# Patient Record
Sex: Male | Born: 2000 | Race: Black or African American | Hispanic: No | Marital: Single
Health system: Southern US, Community
[De-identification: ages and names within clinical notes are randomized; demographics above are authoritative.]

## PROBLEM LIST (undated history)

## (undated) HISTORY — PX: TONSILLECTOMY: SUR1361

---
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
## 2022-03-22 ENCOUNTER — Encounter (HOSPITAL_COMMUNITY): Payer: Self-pay | Admitting: Emergency Medicine

## 2022-03-22 ENCOUNTER — Ambulatory Visit (HOSPITAL_COMMUNITY)
Admit: 2022-03-22 | Discharge: 2022-03-22 | Disposition: A | Discharge status: Home / Self Care | Payer: Medicaid Other | Attending: Internal Medicine | Admitting: Internal Medicine

## 2022-03-22 ENCOUNTER — Ambulatory Visit (HOSPITAL_COMMUNITY)
Admission: EM | Admit: 2022-03-22 | Discharge: 2022-03-22 | Disposition: A | Discharge status: Home / Self Care | Payer: Medicaid Other | Attending: Internal Medicine | Admitting: Internal Medicine

## 2022-03-22 DIAGNOSIS — N451 Epididymitis: Secondary | ICD-10-CM | POA: Diagnosis not present

## 2022-03-22 LAB — POCT URINALYSIS DIPSTICK, ED / UC
Bilirubin Urine: NEGATIVE
Glucose, UA: NEGATIVE mg/dL
Ketones, ur: 15 mg/dL — AB
Nitrite: NEGATIVE
Protein, ur: 30 mg/dL — AB
Specific Gravity, Urine: 1.025 (ref 1.005–1.030)
Urobilinogen, UA: 0.2 mg/dL (ref 0.0–1.0)
pH: 5.5 (ref 5.0–8.0)

## 2022-03-22 MED ORDER — DOXYCYCLINE HYCLATE 100 MG PO CAPS: 100.0000 mg | ORAL_CAPSULE | Freq: Two times a day (BID) | ORAL | 0 refills | Status: AC

## 2022-03-22 MED ORDER — CEFTRIAXONE SODIUM 500 MG IJ SOLR
INTRAMUSCULAR | Status: AC
  Filled 2022-03-22: qty 500

## 2022-03-22 MED ORDER — LIDOCAINE HCL (PF) 1 % IJ SOLN
INTRAMUSCULAR | Status: AC
  Filled 2022-03-22: qty 2

## 2022-03-22 MED ORDER — CEFTRIAXONE SODIUM 500 MG IJ SOLR
500.0000 mg | Freq: Once | INTRAMUSCULAR | Status: AC
  Administered 2022-03-22: 500 mg via INTRAMUSCULAR

## 2022-03-22 NOTE — Discharge Instructions (Addendum)
We will call you with recommendations if labs are abnormal Take medications as prescribed Ibuprofen as needed for pain Please go for the ultrasound of the testis If you have worsening symptoms please return to urgent care to be evaluated.

## 2022-03-22 NOTE — ED Triage Notes (Addendum)
Patient c/o LFT sided testicular pain that started this morning.   Patient denies difficulty with urination. Patient denies penile discharge. Patient denies any change in sexual partners or concern of STI.   Patient endorses testicular swelling.   Patient endorses increased pain with movement.   Patient endorses a previous episode of lymph node swelling on that same side of groin that has " gone down" per patient statement.   Patient hasn't taken any medications for symptoms.

## 2022-03-23 LAB — CYTOLOGY, (ORAL, ANAL, URETHRAL) ANCILLARY ONLY
Chlamydia: NEGATIVE
Comment: NEGATIVE
Comment: NEGATIVE
Comment: NORMAL
Neisseria Gonorrhea: NEGATIVE
Trichomonas: NEGATIVE

## 2022-03-23 NOTE — ED Provider Notes (Signed)
[facility]    CSN: [id number] Arrival date & time: 03/22/22  1109      History   Chief Complaint Chief Complaint  Patient presents with   Testicle Pain    HPI Todd Underwood is a 21 y.o. male comes to the urgent care with a 1 day history of left testicular pain.  Pain is sharp and throbbing.  Is aggravated by palpation with no known relieving factors.  It is associated with testicular swelling.  Patient denies any trauma to the testicle or the scrotum.  No groin pain.  No penile discharge, pain on urination, urgency of urination or frequency of urination.  Patient is sexually active.  He is in a monogamous relationship with his fiance.  No rash on the penis.   HPI  History reviewed. No pertinent past medical history.  There are no problems to display for this patient.   Past Surgical History:  Procedure Laterality Date   TONSILLECTOMY         Home Medications    Prior to Admission medications   Medication Sig Start Date End Date Taking? Authorizing Provider  doxycycline (VIBRAMYCIN) 100 MG capsule Take 1 capsule (100 mg total) by mouth 2 (two) times daily for 7 days. 03/22/22 03/29/22 Yes Jalah Warmuth, Britta Mccreedy, MD    Family History History reviewed. No pertinent family history.  Social History Social History   Tobacco Use   Smoking status: Passive Smoke Exposure - Never Smoker   Smokeless tobacco: Never     Allergies   Other   Review of Systems Review of Systems  Respiratory: Negative.    Gastrointestinal: Negative.   Genitourinary:  Positive for scrotal swelling and testicular pain. Negative for dysuria, frequency, penile pain, penile swelling and urgency.  Musculoskeletal: Negative.   Neurological: Negative.     Physical Exam Triage Vital Signs ED Triage Vitals  Enc Vitals Group     BP 03/22/22 1157 121/77     Pulse Rate 03/22/22 1157 81     Resp 03/22/22 1157 16     Temp 03/22/22 1157 98.5 F (36.9 C)     Temp Source 03/22/22 1157  Oral     SpO2 03/22/22 1157 98 %     Weight --      Height --      Head Circumference --      Peak Flow --      Pain Score 03/22/22 1203 10     Pain Loc --      Pain Edu? --      Excl. in GC? --    No data found.  Updated Vital Signs BP 121/77 (BP Location: Left Arm)   Pulse 81   Temp 98.5 F (36.9 C) (Oral)   Resp 16   SpO2 98%   Visual Acuity Right Eye Distance:   Left Eye Distance:   Bilateral Distance:    Right Eye Near:   Left Eye Near:    Bilateral Near:     Physical Exam Vitals and nursing note reviewed.  Constitutional:      Appearance: Normal appearance.  Cardiovascular:     Rate and Rhythm: Normal rate and regular rhythm.     Pulses: Normal pulses.     Heart sounds: Normal heart sounds.  Pulmonary:     Effort: Pulmonary effort is normal.     Breath sounds: Normal breath sounds.  Genitourinary:    Penis: Normal.      Comments: Left testicular pain with tenderness  on the posterior superior aspect of the left testicle.  No scrotal swelling or redness.  No rash on the penis.  No penile discharge. Neurological:     Mental Status: He is alert.     UC Treatments / Results  Labs (all labs ordered are listed, but only abnormal results are displayed) Labs Reviewed  URINE CULTURE - Abnormal; Notable for the following components:      Result Value   Culture >=100,000 COLONIES/mL GRAM NEGATIVE RODS (*)    All other components within normal limits  POCT URINALYSIS DIPSTICK, ED / UC - Abnormal; Notable for the following components:   Ketones, ur 15 (*)    Hgb urine dipstick SMALL (*)    Protein, ur 30 (*)    Leukocytes,Ua SMALL (*)    All other components within normal limits  CYTOLOGY, (ORAL, ANAL, URETHRAL) ANCILLARY ONLY    EKG   Radiology US SCROTUM W/DOPPLER  Result Date: 03/22/2022 CLINICAL DATA:  Left testicular pain and swelling EXAM: SCROTAL ULTRASOUND DOPPLER ULTRASOUND OF THE TESTICLES TECHNIQUE: Complete ultrasound examination of the  testicles, epididymis, and other scrotal structures was performed. Color and spectral Doppler ultrasound were also utilized to evaluate blood flow to the testicles. COMPARISON:  None Available. FINDINGS: Right testicle Measurements: 4.3 x 2.0 x 2.9 cm. No mass or microlithiasis visualized. Left testicle Measurements: 4.3 x 2.0 x 2.8 cm. No mass or microlithiasis visualized. Right epididymis:  Normal in size and appearance. Left epididymis: 1.0 cm simple cyst in the left epididymal head. Epididymal body and tail are enlarged and heterogeneous with increased vascularity. Hydrocele:  Small left hydrocele. Varicocele:  Bilateral varicoceles. Pulsed Doppler interrogation of both testes demonstrates normal low resistance arterial and venous waveforms bilaterally. Other: Right inguinal hernia with fluid seen in the hernia sac. IMPRESSION: 1. Negative for testicular torsion or intratesticular mass. 2. Appearance suggestive of acute left epididymitis. 3. Small left hydrocele, likely reactive. 4. Bilateral varicoceles. 5. Right inguinal hernia with fluid seen in the hernia sac. Electronically Signed   By: Duanne Guess D.O.   On: 03/22/2022 15:07    Procedures Procedures (including critical care time)  Medications Ordered in UC Medications  cefTRIAXone (ROCEPHIN) injection 500 mg (500 mg Intramuscular Given 03/22/22 1306)    Initial Impression / Assessment and Plan / UC Course  I have reviewed the triage vital signs and the nursing notes.  Pertinent labs & imaging results that were available during my care of the patient were reviewed by me and considered in my medical decision making (see chart for details).     1.  Acute epididymitis: Point-of-care urinalysis is positive for leukocyte esterase, hemoglobin Urine cultures have been sent Scrotal ultrasound is significant for epididymitis in the left testicle. Ceftriaxone 500 mg IM x1 dose Doxycycline 100 mg twice daily for 7 days We will call patient  with recommendations if labs are abnormal. Final Clinical Impressions(s) / UC Diagnoses   Final diagnoses:  Epididymitis     Discharge Instructions      We will call you with recommendations if labs are abnormal Take medications as prescribed Ibuprofen as needed for pain Please go for the ultrasound of the testis If you have worsening symptoms please return to urgent care to be evaluated.   ED Prescriptions     Medication Sig Dispense Auth. Provider   doxycycline (VIBRAMYCIN) 100 MG capsule Take 1 capsule (100 mg total) by mouth 2 (two) times daily for 7 days. 14 capsule Nilda Keathley, Britta Mccreedy, MD  PDMP not reviewed this encounter.   Merrilee JanskyLamptey, Raymonde Hamblin O, MD 03/23/22 [id number]

## 2022-03-24 LAB — URINE CULTURE: Culture: >=100000 — AB

## 2022-06-24 ENCOUNTER — Other Ambulatory Visit: Payer: Self-pay

## 2022-06-24 ENCOUNTER — Encounter (HOSPITAL_COMMUNITY): Payer: Self-pay | Admitting: *Deleted

## 2022-06-24 ENCOUNTER — Ambulatory Visit (HOSPITAL_COMMUNITY)
Admission: EM | Admit: 2022-06-24 | Discharge: 2022-06-24 | Disposition: A | Discharge status: Home / Self Care | Payer: Medicaid Other | Attending: Internal Medicine | Admitting: Internal Medicine

## 2022-06-24 DIAGNOSIS — R112 Nausea with vomiting, unspecified: Secondary | ICD-10-CM

## 2022-06-24 DIAGNOSIS — Z789 Other specified health status: Secondary | ICD-10-CM | POA: Diagnosis not present

## 2022-06-24 MED ORDER — ONDANSETRON 4 MG PO TBDP: 4.0000 mg | ORAL_TABLET | Freq: Three times a day (TID) | ORAL | 0 refills | Status: AC | PRN

## 2022-06-24 NOTE — ED Provider Notes (Signed)
[facility]    CSN: [id number] Arrival date & time: 06/24/22  1032      History   Chief Complaint Chief Complaint  Patient presents with   Emesis    HPI Todd Underwood is a 21 y.o. male.   Patient presents to urgent care for evaluation of nausea and vomiting after drinking an entire bottle of Hennessy brandy last night. States he walked into to work this morning at [employer], became nauseous, vomited one time, and was sent home from work.  He is requesting a work note.  Denies abdominal pain, headache, fever and chills, diarrhea, urinary symptoms, back pain, neck pain, dizziness, and sore throat.  Emesis was nonbloody/bilious.  He is not currently nauseous and states that he feels better after throwing up.  He has not attempted use of any over-the-counter medications prior to arrival urgent care for his symptoms.  States that he "does not usually drink that much".  No known sick contacts.     Emesis   History reviewed. No pertinent past medical history.  There are no problems to display for this patient.   Past Surgical History:  Procedure Laterality Date   TONSILLECTOMY         Home Medications    Prior to Admission medications   Medication Sig Start Date End Date Taking? Authorizing Provider  ondansetron (ZOFRAN-ODT) 4 MG disintegrating tablet Take 1 tablet (4 mg total) by mouth every 8 (eight) hours as needed for nausea or vomiting. 06/24/22  Yes Carlisle Beers, FNP    Family History History reviewed. No pertinent family history.  Social History Social History   Tobacco Use   Smoking status: Passive Smoke Exposure - Never Smoker   Smokeless tobacco: Never     Allergies   Other   Review of Systems Review of Systems  Gastrointestinal:  Positive for vomiting.  Per HPI   Physical Exam Triage Vital Signs ED Triage Vitals  Enc Vitals Group     BP 06/24/22 1121 114/73     Pulse Rate 06/24/22 1121 84     Resp 06/24/22 1121 16      Temp 06/24/22 1121 98.4 F (36.9 C)     Temp src --      SpO2 06/24/22 1121 98 %     Weight --      Height --      Head Circumference --      Peak Flow --      Pain Score 06/24/22 1120 0     Pain Loc --      Pain Edu? --      Excl. in GC? --    No data found.  Updated Vital Signs BP 114/73   Pulse 84   Temp 98.4 F (36.9 C)   Resp 16   SpO2 98%   Visual Acuity Right Eye Distance:   Left Eye Distance:   Bilateral Distance:    Right Eye Near:   Left Eye Near:    Bilateral Near:     Physical Exam Vitals and nursing note reviewed.  Constitutional:      Appearance: Normal appearance. He is not ill-appearing or toxic-appearing.     Comments: Very pleasant patient sitting on exam in position of comfort table in no acute distress.   HENT:     Head: Normocephalic and atraumatic.     Right Ear: Hearing and external ear normal.     Left Ear: Hearing and external ear normal.  Nose: Nose normal.     Mouth/Throat:     Lips: Pink.     Mouth: Mucous membranes are moist.     Pharynx: No posterior oropharyngeal erythema.  Eyes:     General: Lids are normal. Vision grossly intact. Gaze aligned appropriately.     Extraocular Movements: Extraocular movements intact.     Conjunctiva/sclera: Conjunctivae normal.  Cardiovascular:     Rate and Rhythm: Normal rate and regular rhythm.     Heart sounds: Normal heart sounds, S1 normal and S2 normal.  Pulmonary:     Effort: Pulmonary effort is normal. No respiratory distress.     Breath sounds: Normal breath sounds and air entry.  Abdominal:     General: Bowel sounds are normal.     Palpations: Abdomen is soft.     Tenderness: There is no abdominal tenderness. There is no right CVA tenderness, left CVA tenderness or guarding.  Musculoskeletal:     Cervical back: Neck supple.  Skin:    General: Skin is warm and dry.     Capillary Refill: Capillary refill takes less than 2 seconds.     Findings: No rash.  Neurological:      General: No focal deficit present.     Mental Status: He is alert and oriented to person, place, and time. Mental status is at baseline.     Cranial Nerves: No dysarthria or facial asymmetry.     Gait: Gait is intact.  Psychiatric:        Mood and Affect: Mood normal.        Speech: Speech normal.        Behavior: Behavior normal.        Thought Content: Thought content normal.        Judgment: Judgment normal.      UC Treatments / Results  Labs (all labs ordered are listed, but only abnormal results are displayed) Labs Reviewed - No data to display  EKG   Radiology No results found.  Procedures Procedures (including critical care time)  Medications Ordered in UC Medications - No data to display  Initial Impression / Assessment and Plan / UC Course  I have reviewed the triage vital signs and the nursing notes.  Pertinent labs & imaging results that were available during my care of the patient were reviewed by me and considered in my medical decision making (see chart for details).   1.  Nausea and vomiting due to heavy alcohol consumption Physical exam findings in the clinic are stable today.  Vital signs are hemodynamically stable as well.  Patient is not currently nauseous at that time and therefore we deferred Zofran administration in the clinic.  Zofran 4 mg sent to the pharmacy to be taken every 8 hours as needed for nausea and vomiting.  Advised patient to stop consuming alcohol as he is not of age.  Advised patient that if he is to drink alcohol, he is not to drive while drinking due to risk for injuring himself and others.  Advised patient to drink plenty of water to stay well-hydrated and eat bland foods over the next 12 to 24 hours to allow his stomach to heal.  Abdominal exam is stable and patient is agreeable with plan. Work note given.   Discussed physical exam and available lab work findings in clinic with patient.  Counseled patient regarding appropriate use of  medications and potential side effects for all medications recommended or prescribed today. Discussed red flag signs and  symptoms of worsening condition,when to call the PCP office, return to urgent care, and when to seek higher level of care in the emergency department. Patient verbalizes understanding and agreement with plan. All questions answered. Patient discharged in stable condition.  Final Clinical Impressions(s) / UC Diagnoses   Final diagnoses:  Nausea and vomiting, unspecified vomiting type  Alcohol consumption heavy     Discharge Instructions      Increase your water intake to at least 8 cups of water per day to prevent dehydration. Take Zofran every 8 hours as needed for nausea and vomiting. Eat a bland diet over the next 12 to 24 hours to allow your stomach to heal.  Avoid drinking excessive amounts of alcohol in the future.  Return to urgent care as needed if you develop any new or worsening symptoms.     ED Prescriptions     Medication Sig Dispense Auth. Provider   ondansetron (ZOFRAN-ODT) 4 MG disintegrating tablet Take 1 tablet (4 mg total) by mouth every 8 (eight) hours as needed for nausea or vomiting. 20 tablet Carlisle Beers, FNP      PDMP not reviewed this encounter.   Carlisle Beers, [place] 06/24/22 1214

## 2022-06-24 NOTE — ED Triage Notes (Signed)
PT reports he vomited at work x1 this AM and his work sent himto get a work note. Pt reports he was drinking alcohol ,a lot of it,last night.

## 2022-06-24 NOTE — Discharge Instructions (Signed)
Increase your water intake to at least 8 cups of water per day to prevent dehydration. Take Zofran every 8 hours as needed for nausea and vomiting. Eat a bland diet over the next 12 to 24 hours to allow your stomach to heal.  Avoid drinking excessive amounts of alcohol in the future.  Return to urgent care as needed if you develop any new or worsening symptoms.

## 2022-09-23 ENCOUNTER — Emergency Department (HOSPITAL_BASED_OUTPATIENT_CLINIC_OR_DEPARTMENT_OTHER): Payer: Medicaid Other

## 2022-09-23 ENCOUNTER — Emergency Department (HOSPITAL_BASED_OUTPATIENT_CLINIC_OR_DEPARTMENT_OTHER)
Admission: EM | Admit: 2022-09-23 | Discharge: 2022-09-23 | Disposition: A | Discharge status: Home / Self Care | Payer: Medicaid Other | Attending: Emergency Medicine | Admitting: Emergency Medicine

## 2022-09-23 ENCOUNTER — Ambulatory Visit
Admission: EM | Admit: 2022-09-23 | Discharge: 2022-09-23 | Disposition: A | Discharge status: Home / Self Care | Payer: Medicaid Other

## 2022-09-23 DIAGNOSIS — I861 Scrotal varices: Secondary | ICD-10-CM | POA: Diagnosis not present

## 2022-09-23 DIAGNOSIS — K4090 Unilateral inguinal hernia, without obstruction or gangrene, not specified as recurrent: Secondary | ICD-10-CM | POA: Diagnosis not present

## 2022-09-23 DIAGNOSIS — N452 Orchitis: Secondary | ICD-10-CM | POA: Diagnosis not present

## 2022-09-23 DIAGNOSIS — N50811 Right testicular pain: Secondary | ICD-10-CM | POA: Diagnosis present

## 2022-09-23 LAB — HIV ANTIBODY (ROUTINE TESTING W REFLEX): HIV Screen 4th Generation wRfx: NONREACTIVE

## 2022-09-23 MED ORDER — CEFTRIAXONE SODIUM 500 MG IJ SOLR
500.0000 mg | Freq: Once | INTRAMUSCULAR | Status: AC
  Administered 2022-09-23: 500 mg via INTRAMUSCULAR
  Filled 2022-09-23: qty 500

## 2022-09-23 MED ORDER — OXYCODONE-ACETAMINOPHEN 5-325 MG PO TABS
2.0000 | ORAL_TABLET | Freq: Once | ORAL | Status: AC
  Administered 2022-09-23: 2 via ORAL
  Filled 2022-09-23: qty 2

## 2022-09-23 MED ORDER — DOXYCYCLINE HYCLATE 100 MG PO TABS: 100.0000 mg | ORAL_TABLET | Freq: Two times a day (BID) | ORAL | 0 refills | Status: AC

## 2022-09-23 MED ORDER — LIDOCAINE HCL (PF) 1 % IJ SOLN
1.0000 mL | Freq: Once | INTRAMUSCULAR | Status: AC
  Administered 2022-09-23: 1 mL
  Filled 2022-09-23: qty 5

## 2022-09-23 MED ORDER — AZITHROMYCIN 250 MG PO TABS
1000.0000 mg | ORAL_TABLET | Freq: Once | ORAL | Status: AC
  Administered 2022-09-23: 1000 mg via ORAL
  Filled 2022-09-23: qty 4

## 2022-09-23 NOTE — ED Provider Notes (Signed)
[facility] EMERGENCY DEPT Provider Note   CSN: [id number] Arrival date & time: 09/23/22  1216     History  Chief Complaint  Patient presents with   Testicle Pain    Todd Underwood is a 21 y.o. male.  HPI   21 year old male presents today complaining of right testicle pain.  He states that he had noted some swelling for couple of days and then had pain that began this morning.  Initially presented to urgent care and was sent to the ED.  He also reports that he has some urethral discharge.  He reports he was treated for some similar symptoms about 6 months ago.  He reports 1 sexual partner for the past 2 years.  Home Medications Prior to Admission medications   Medication Sig Start Date End Date Taking? Authorizing Provider  doxycycline (VIBRA-TABS) 100 MG tablet Take 1 tablet (100 mg total) by mouth 2 (two) times daily. 09/23/22  Yes Margarita Grizzle, MD  ondansetron (ZOFRAN-ODT) 4 MG disintegrating tablet Take 1 tablet (4 mg total) by mouth every 8 (eight) hours as needed for nausea or vomiting. 06/24/22   Carlisle Beers, FNP      Allergies    Other    Review of Systems   Review of Systems  Physical Exam Updated Vital Signs BP (!) 130/90 (BP Location: Right Arm)   Pulse 88   Resp (!) 24   SpO2 97%  Physical Exam Vitals and nursing note reviewed.  Constitutional:      Appearance: Normal appearance.  HENT:     Head: Normocephalic.     Right Ear: External ear normal.     Left Ear: External ear normal.     Nose: Nose normal.     Mouth/Throat:     Pharynx: Oropharynx is clear.  Eyes:     Pupils: Pupils are equal, round, and reactive to light.  Cardiovascular:     Rate and Rhythm: Normal rate and regular rhythm.     Pulses: Normal pulses.  Pulmonary:     Effort: Pulmonary effort is normal.  Abdominal:     General: Abdomen is flat.  Genitourinary:    Penis: Normal.      Comments: Some swelling right testicle, no overlying redness or  fluctuance No urethral discharge noted on my exam Musculoskeletal:        General: Normal range of motion.  Skin:    General: Skin is warm and dry.     Capillary Refill: Capillary refill takes less than 2 seconds.  Neurological:     General: No focal deficit present.     Mental Status: He is alert.  Psychiatric:        Mood and Affect: Mood normal.     ED Results / Procedures / Treatments   Labs (all labs ordered are listed, but only abnormal results are displayed) Labs Reviewed  HIV ANTIBODY (ROUTINE TESTING W REFLEX)  GC/CHLAMYDIA PROBE AMP ([facility]) NOT AT [facility]    EKG None  Radiology US SCROTUM W/DOPPLER  Result Date: 09/23/2022 CLINICAL DATA:  Right testicular pain and swelling for 3 days. EXAM: SCROTAL ULTRASOUND DOPPLER ULTRASOUND OF THE TESTICLES TECHNIQUE: Complete ultrasound examination of the testicles, epididymis, and other scrotal structures was performed. Color and spectral Doppler ultrasound were also utilized to evaluate blood flow to the testicles. COMPARISON:  March 22, 2022 FINDINGS: Right testicle Measurements: 4.1 x 2.4 x 2.9 cm. No mass or microlithiasis visualized. Mild increased flow is identified in the right testicle  relative to the left. Left testicle Measurements: 4.1 x 2.1 x 2.6 cm. No mass or microlithiasis visualized. Right epididymis:  Normal in size and appearance. Left epididymis:  8 mm epididymal head cyst. Hydrocele:  None visualized. Varicocele:  Bilateral varicoceles. Pulsed Doppler interrogation of both testes demonstrates normal low resistance arterial and venous waveforms bilaterally. Right inguinal hernia with fluid seen in the hernia sac. IMPRESSION: 1. Probable mild right orchitis. 2. No evidence of testicular torsion. 3. Right inguinal hernia with fluid seen in the hernia sac. 4. Bilateral varicoceles. Electronically Signed   By: Sherian Rein M.D.   On: 09/23/2022 13:30    Procedures Procedures    Medications Ordered in ED Medications   cefTRIAXone (ROCEPHIN) injection 500 mg (has no administration in time range)  lidocaine (PF) (XYLOCAINE) 1 % injection 1-2.1 mL (has no administration in time range)  azithromycin (ZITHROMAX) tablet 1,000 mg (has no administration in time range)  oxyCODONE-acetaminophen (PERCOCET/ROXICET) 5-325 MG per tablet 2 tablet (2 tablets Oral Given 09/23/22 1245)    ED Course/ Medical Decision Making/ A&P Clinical Course as of 09/23/22 1417  Sun Sep 23, 2022  1413 Ultrasound obtained and significant for probable mild right orchitis, no evidence of testicular torsion, right inguinal hernia with fluid seen in the hernia sac and bilateral varicoceles [DR]    Clinical Course User Index [DR] Margarita Grizzle, MD                           Medical Decision Making 21 year old male presents today complaining of right testicle swelling followed by pain.  Here he is obtained ultrasound does not show any evidence of torsion.  He does have some evidence of orchitis and right inguinal hernia as well as bilateral varicoceles Patient is treated here with Rocephin and doxycycline He is advised regarding management of painful with cold therapy and elevation Plan outpatient referral to urology  Amount and/or Complexity of Data Reviewed Radiology: ordered.  Risk Prescription drug management.           Final Clinical Impression(s) / ED Diagnoses Final diagnoses:  Orchitis  Varicocele  Unilateral inguinal hernia without obstruction or gangrene, recurrence not specified    Rx / DC Orders ED Discharge Orders          Ordered    doxycycline (VIBRA-TABS) 100 MG tablet  2 times daily        09/23/22 1411              Margarita Grizzle, MD 09/23/22 1417

## 2022-09-23 NOTE — ED Notes (Signed)
Patient is being discharged from the Urgent Care and sent to the Emergency Department via ems . Per haley, patient is in need of higher level of care due to severe testicular pain. Patient is aware and verbalizes understanding of plan of care.  Vitals:   09/23/22 1110  Pulse: 88  Resp: (!) 24  SpO2: 97%

## 2022-09-23 NOTE — ED Triage Notes (Signed)
Pt c/o severe testicular pain and lymphadenopathy in the groin onset ~ 2 days ago concerned for torsion states this has happened before.

## 2022-09-23 NOTE — Discharge Instructions (Signed)
Please take antibiotics as prescribed You may elevate testicle and use acetaminophen and ibuprofen for pain When you have to get up you should use compression garments Please call urology for outpatient follow-up for hernia If area becomes hard or you are unable to reduce this area, please return for reevaluation

## 2022-09-23 NOTE — ED Notes (Signed)
Per EMS, pt sent to ED from UC via [facility] ambulance for possible testicular torsion.

## 2022-09-23 NOTE — ED Triage Notes (Addendum)
Pt reports right testicular pain started this morning after noting a swollen lymph node in the area 2 days ago. Pt reports 1 similar episode in the past and was treated with Abx for torsion and symptoms resolved.  Pt also states his semen has a yellowish tinge to it.  Denies any urinary symptoms, n/v/d.Marland Kitchen  Pt wrything in bed during triage. Seen at [facility] today and sent to ED for further evaluation.

## 2022-09-23 NOTE — Discharge Instructions (Addendum)
Patient sent to hospital via EMS.  

## 2022-09-23 NOTE — ED Provider Notes (Signed)
[facility]    CSN: [id number] Arrival date & time: 09/23/22  1105      History   Chief Complaint Chief Complaint  Patient presents with   Testicle Pain    HPI Todd Underwood is a 21 y.o. male.   Patient presents with right testicular pain that started 2 days ago.  Patient denies any obvious injury or trauma to the testicle.  He denies exposure to STD.  Denies penile discharge, dysuria, urinary frequency, abdominal pain, back pain, fever.  He has not taken any medications for pain.   Testicle Pain    History reviewed. No pertinent past medical history.  There are no problems to display for this patient.   Past Surgical History:  Procedure Laterality Date   TONSILLECTOMY         Home Medications    Prior to Admission medications   Medication Sig Start Date End Date Taking? Authorizing Provider  ondansetron (ZOFRAN-ODT) 4 MG disintegrating tablet Take 1 tablet (4 mg total) by mouth every 8 (eight) hours as needed for nausea or vomiting. 06/24/22   Carlisle Beers, FNP    Family History History reviewed. No pertinent family history.  Social History Social History   Tobacco Use   Smoking status: Passive Smoke Exposure - Never Smoker   Smokeless tobacco: Never     Allergies   Other   Review of Systems Review of Systems Per HPI  Physical Exam Triage Vital Signs ED Triage Vitals  Enc Vitals Group     BP --      Pulse Rate 09/23/22 1110 88     Resp 09/23/22 1110 (!) 24     Temp --      Temp src --      SpO2 09/23/22 1110 97 %     Weight --      Height --      Head Circumference --      Peak Flow --      Pain Score 09/23/22 1108 10     Pain Loc --      Pain Edu? --      Excl. in GC? --    No data found.  Updated Vital Signs BP 114/64   Pulse 88   Resp (!) 24   SpO2 97%   Visual Acuity Right Eye Distance:   Left Eye Distance:   Bilateral Distance:    Right Eye Near:   Left Eye Near:    Bilateral Near:      Physical Exam Exam conducted with a chaperone present.  Constitutional:      General: He is in acute distress.     Appearance: Normal appearance.     Comments: Patient is restless and will not sit still due to pain.  HENT:     Head: Normocephalic and atraumatic.  Pulmonary:     Effort: Pulmonary effort is normal.  Genitourinary:    Comments: Right testicular swelling and mild erythema.  Unable to evaluate cremasteric reflex given that patient will not cooperate or sit still. Neurological:     General: No focal deficit present.     Mental Status: He is alert and oriented to person, place, and time.      UC Treatments / Results  Labs (all labs ordered are listed, but only abnormal results are displayed) Labs Reviewed - No data to display  EKG   Radiology No results found.  Procedures Procedures (including critical care time)  Medications Ordered in UC  Medications - No data to display  Initial Impression / Assessment and Plan / UC Course  I have reviewed the triage vital signs and the nursing notes.  Pertinent labs & imaging results that were available during my care of the patient were reviewed by me and considered in my medical decision making (see chart for details).     Given the amount of pain that patient appears to be in, patient will most likely need an ultrasound of the testicle which cannot be performed here in urgent care.  Therefore, patient was advised to go to the ER for further evaluation and management.  Unable to get complete set of vital signs given that patient is restless and will not sit still or cooperate.  Patient was insistent on going by ambulance to the ER.  Patient left via EMS transport. Final Clinical Impressions(s) / UC Diagnoses   Final diagnoses:  Right testicular pain     Discharge Instructions      Patient sent to hospital via EMS.     ED Prescriptions   None    PDMP not reviewed this encounter.   Gustavus Bryant,  [place] 09/23/22 1140

## 2022-09-24 LAB — GC/CHLAMYDIA PROBE AMP (~~LOC~~) NOT AT ARMC
Chlamydia: NEGATIVE
Comment: NEGATIVE
Comment: NORMAL
Neisseria Gonorrhea: NEGATIVE

## 2023-10-18 IMAGING — US US SCROTUM W/ DOPPLER COMPLETE
1 series · 13 of 25 positions shown · non-contrast
Comparison: None Available.

CLINICAL DATA: Left testicular pain and swelling

EXAM:
SCROTAL ULTRASOUND
DOPPLER ULTRASOUND OF THE TESTICLES
TECHNIQUE: Complete ultrasound examination of the testicles, epididymis, and
other scrotal structures was performed. Color and spectral Doppler
ultrasound were also utilized to evaluate blood flow to the
testicles.

[Series 1: us scrotum w/doppler · 106 acquisitions, 13 frames shown]
[im 1/106]
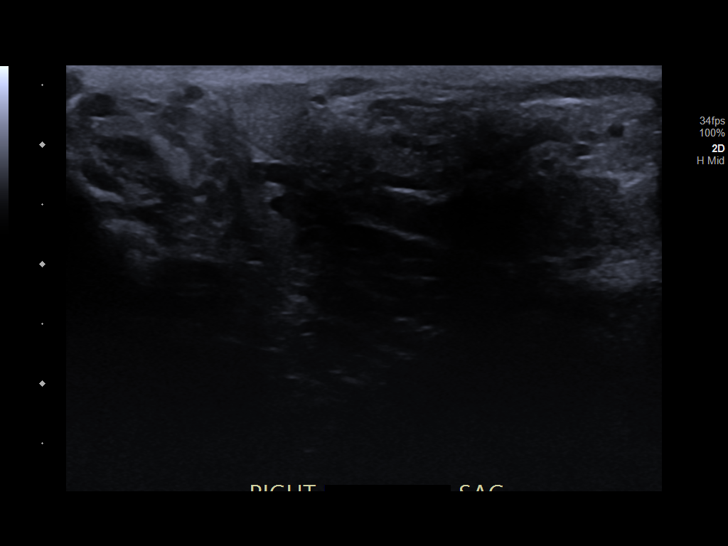
[im 9/106]
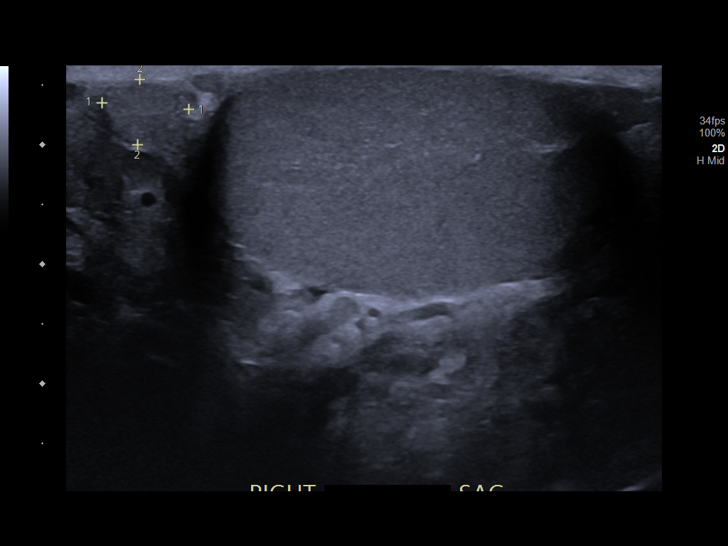
[im 18/106]
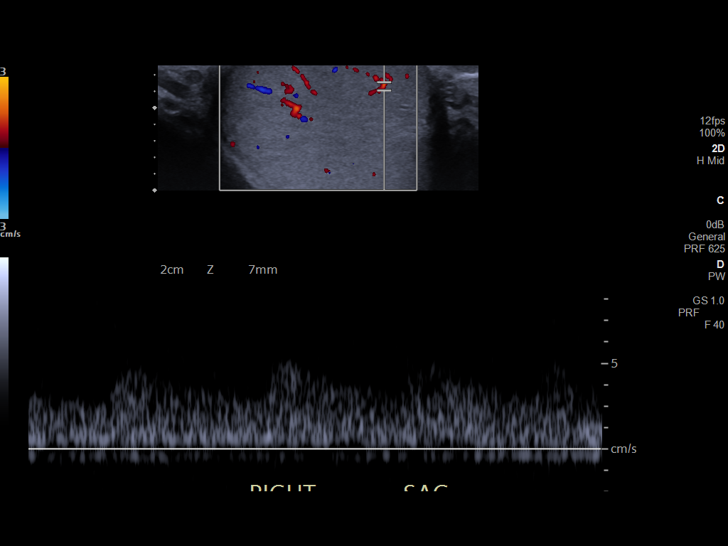
[im 27/106]
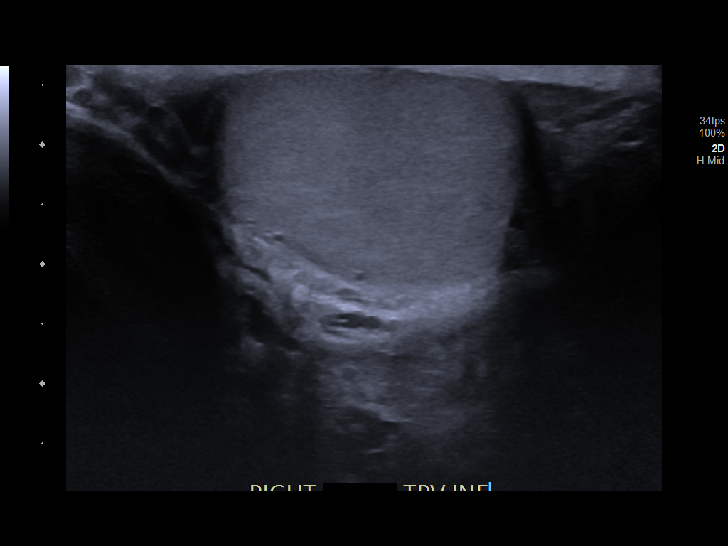
[im 36/106]
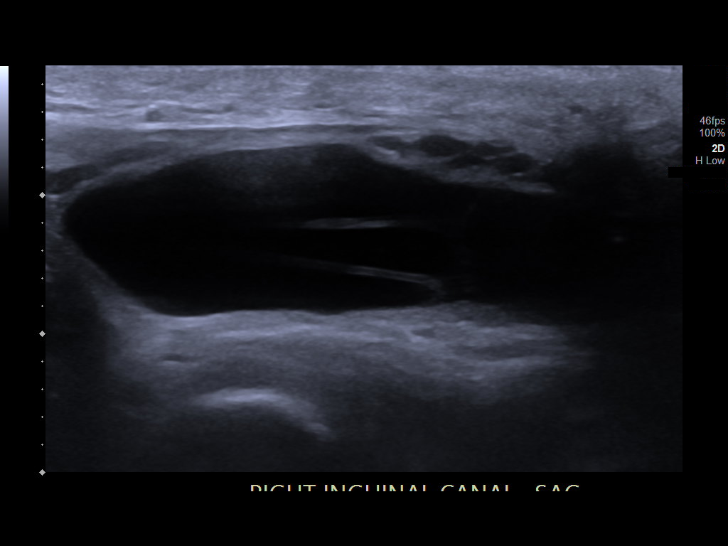
[im 44/106]
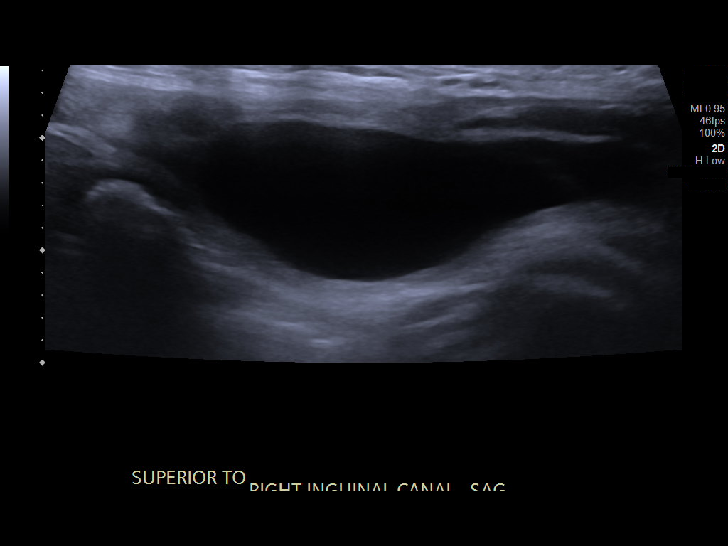
[im 53/106]
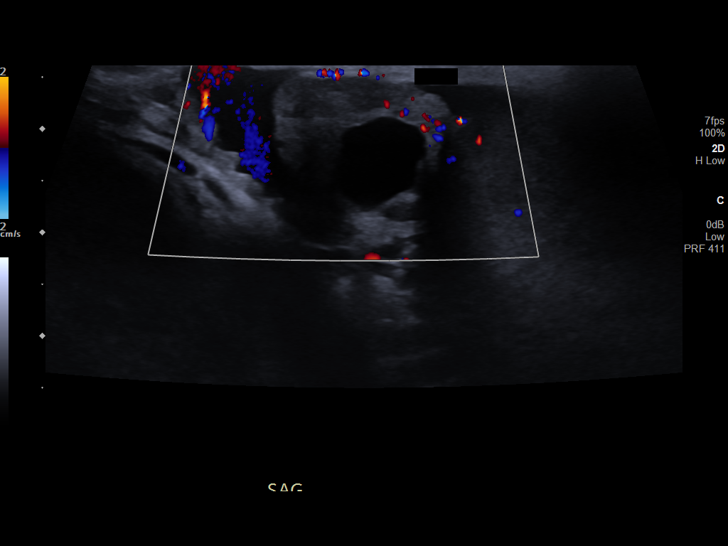
[im 62/106]
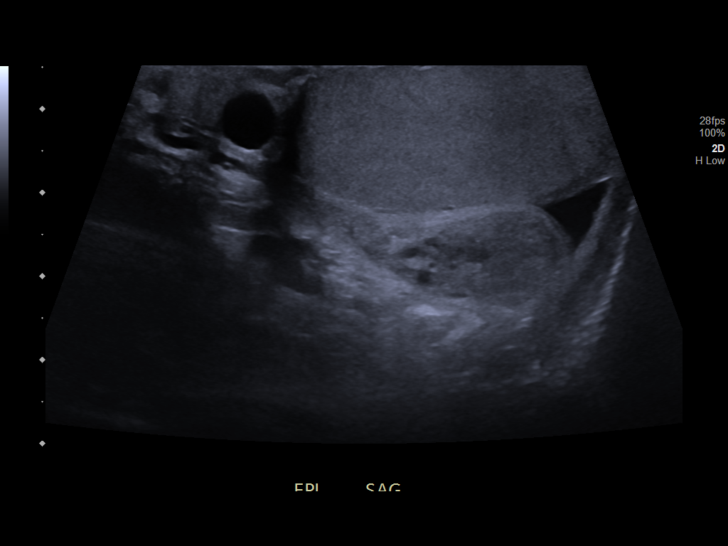
[im 71/106]
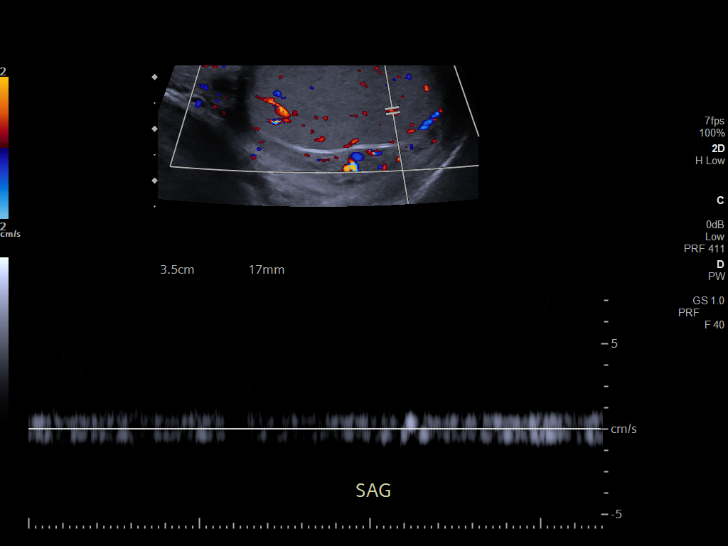
[im 79/106]
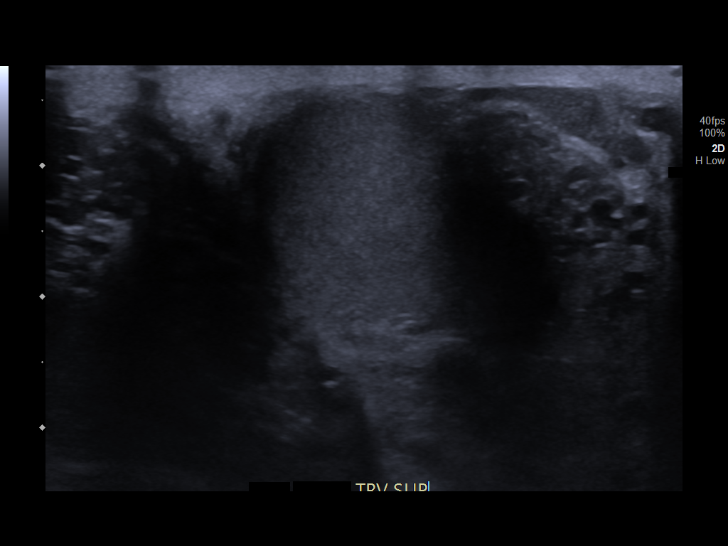
[im 88/106]
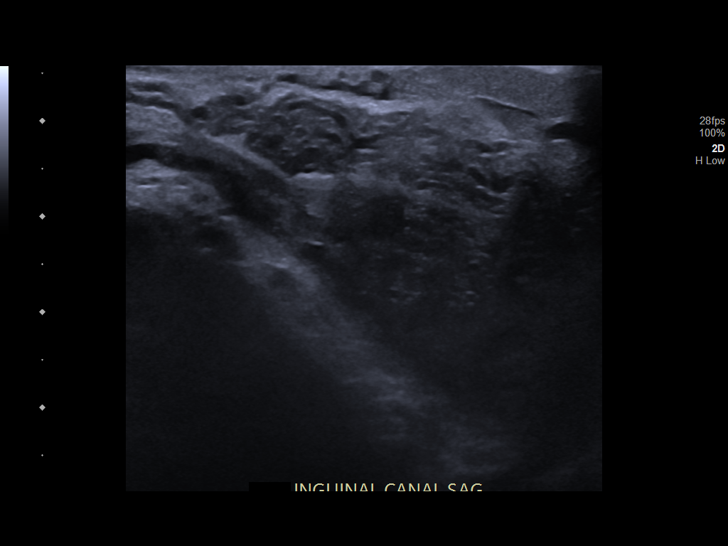
[im 97/106]
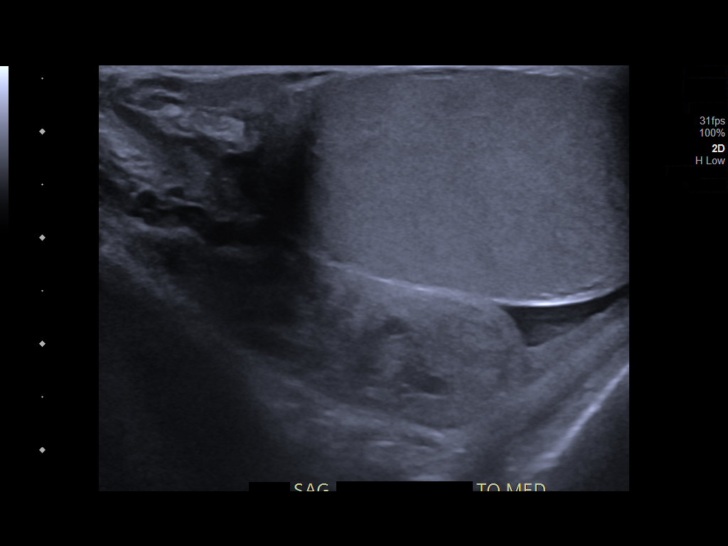
[im 106/106]
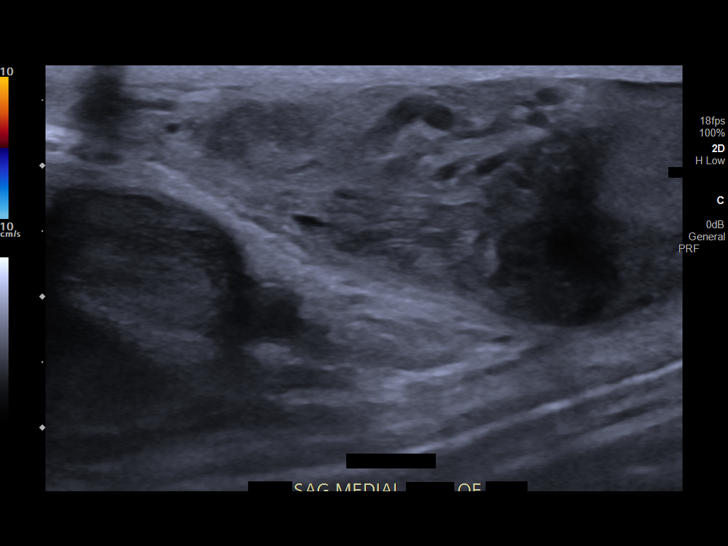

[13 of 25 positions shown; findings below may reference images not displayed]

FINDINGS: Right testicle

Measurements: 4.3 x 2.0 x 2.9 cm. No mass or microlithiasis
visualized.

Left testicle

Measurements: 4.3 x 2.0 x 2.8 cm. No mass or microlithiasis
visualized.

Right epididymis:  Normal in size and appearance.

Left epididymis: 1.0 cm simple cyst in the left epididymal head.
Epididymal body and tail are enlarged and heterogeneous with
increased vascularity.

Hydrocele:  Small left hydrocele.

Varicocele:  Bilateral varicoceles.

Pulsed Doppler interrogation of both testes demonstrates normal low
resistance arterial and venous waveforms bilaterally.

Other: Right inguinal hernia with fluid seen in the hernia sac.
IMPRESSION: 1. Negative for testicular torsion or intratesticular mass.
2. Appearance suggestive of acute left epididymitis.
3. Small left hydrocele, likely reactive.
4. Bilateral varicoceles.
5. Right inguinal hernia with fluid seen in the hernia sac.
